# Patient Record
Sex: Male | Born: 1995 | Race: White | Hispanic: No | Marital: Single | Smoking: Former smoker
Health system: Southern US, Community
[De-identification: ages and names within clinical notes are randomized; demographics above are authoritative.]

## PROBLEM LIST (undated history)

## (undated) DIAGNOSIS — J302 Other seasonal allergic rhinitis: Secondary | ICD-10-CM

---
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
## 2022-11-21 ENCOUNTER — Encounter: Payer: Self-pay | Admitting: Family Medicine

## 2022-11-21 ENCOUNTER — Ambulatory Visit: Payer: BC Managed Care – PPO | Admitting: Family Medicine

## 2022-11-21 VITALS — BP 120/80 | HR 75 | Temp 98.4°F | Ht 67.5 in | Wt 209.8 lb

## 2022-11-21 DIAGNOSIS — Z23 Encounter for immunization: Secondary | ICD-10-CM

## 2022-11-21 DIAGNOSIS — Z720 Tobacco use: Secondary | ICD-10-CM | POA: Insufficient documentation

## 2022-11-21 DIAGNOSIS — G43009 Migraine without aura, not intractable, without status migrainosus: Secondary | ICD-10-CM | POA: Diagnosis not present

## 2022-11-21 DIAGNOSIS — Z13220 Encounter for screening for lipoid disorders: Secondary | ICD-10-CM

## 2022-11-21 DIAGNOSIS — J302 Other seasonal allergic rhinitis: Secondary | ICD-10-CM | POA: Insufficient documentation

## 2022-11-21 DIAGNOSIS — G43909 Migraine, unspecified, not intractable, without status migrainosus: Secondary | ICD-10-CM | POA: Insufficient documentation

## 2022-11-21 LAB — LIPID PANEL
Cholesterol: 180 mg/dL (ref 0–200)
HDL: 49 mg/dL (ref >39.00)
LDL Cholesterol: 118 mg/dL — ABNORMAL HIGH (ref 0–99)
NonHDL: 131.31
Total CHOL/HDL Ratio: 4
Triglycerides: 65 mg/dL (ref 0.0–149.0)
VLDL: 13 mg/dL (ref 0.0–40.0)

## 2022-11-21 MED ORDER — SUMATRIPTAN SUCCINATE 50 MG PO TABS: 50.0000 mg | ORAL_TABLET | ORAL | 0 refills | Status: AC | PRN

## 2022-11-21 NOTE — Assessment & Plan Note (Signed)
I will renew his Imitrex. He is having migraines frequently enough, that I would recommend a neurology evaluation to develop a plan for migraine prevention.

## 2022-11-21 NOTE — Progress Notes (Signed)
[place] [facility] [address] [place] [address] Dept: [phone] Dept Fax: [phone]  New Patient Office Visit  Subjective:    Patient ID: Corey Porter, male    DOB: 09/05/1996, 27 y.o..   MRN: [id number]  Chief Complaint  Patient presents with   Establish Care    NP- Establish care.     History of Present Illness:  Patient is in today to establish care. Corey Porter was born in [place]. He received a welding certificate from [employer]. He attended [employer], majoring in Public relations account executive.  He currently works for [employer] as an Chief Financial Officer. He is single and has no children. Corey Porter admits to use of Zyn nicotine pouches. He notes these are 6 mg strength and he uses about 10 per day.He drinks alcohol on the weekends. He denies any drug use.  Corey Porter has a history of migraine headaches. These started in his teen years. He used to be able to effectively manage this with ibuprofen, but this is not very effective any longer. He has been on sumatriptan int he past and this seems to work well. His migraines occur sporadically, but can occur as often as 12 days per month.  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 (Patient not taking: Reported on 11/21/2022)     fluticasone (FLONASE) 50 MCG/ACT nasal spray Place 2 sprays into the nose daily.     Fluticasone Propionate, Inhal, (FLOVENT IN) Inhale 1 puff into the lungs 2 (two) times daily.      No facility-administered medications prior to visit.   No Known Allergies    Objective:   Today's Vitals   11/21/22 1054  BP: 120/80  Pulse: 75  Temp: 98.4 F (36.9 C)  TempSrc: Temporal  SpO2: 98%  Weight: 209 lb 12.8 oz (95.2 kg)  Height: 5' 7.5" (1.715 m)   Body mass index is 32.37 kg/m.   General: Well developed, well nourished. No acute distress. Psych: Alert and oriented. Normal mood and affect.  Health Maintenance Due  Topic Date Due   HPV VACCINES (1 - Male 2-dose series) Never done   HIV Screening  Never done   Hepatitis C Screening  Never done   DTaP/Tdap/Td (8 - Td or Tdap) 11/15/2018     Assessment & Plan:   Problem List Items Addressed This Visit       Cardiovascular and Mediastinum   Migraine headache - Primary    I will renew his Imitrex. He is having migraines frequently enough, that I would recommend a neurology evaluation to develop a plan for migraine prevention.      Relevant Medications   SUMAtriptan (IMITREX) 50 MG tablet   Other Relevant Orders   Ambulatory referral to Neurology  Other Visit Diagnoses     Screening for lipid disorders       Relevant Orders   Lipid panel   Need for diphtheria-tetanus-pertussis (Tdap) vaccine       Relevant Orders   Tdap vaccine greater than or equal to 7yo IM      Return in about 1 year (around 11/22/2023) for Reassessment.   Haydee Salter, MD

## 2022-11-22 ENCOUNTER — Encounter: Payer: Self-pay | Admitting: Family Medicine

## 2022-12-10 ENCOUNTER — Ambulatory Visit: Payer: Self-pay | Admitting: Nurse Practitioner

## 2023-01-09 DIAGNOSIS — J343 Hypertrophy of nasal turbinates: Secondary | ICD-10-CM | POA: Insufficient documentation

## 2023-01-09 DIAGNOSIS — J342 Deviated nasal septum: Secondary | ICD-10-CM | POA: Insufficient documentation

## 2023-11-24 ENCOUNTER — Encounter: Payer: BC Managed Care – PPO | Admitting: Family Medicine

## 2023-12-22 ENCOUNTER — Encounter: Payer: BC Managed Care – PPO | Admitting: Family Medicine

## 2023-12-30 ENCOUNTER — Encounter: Payer: PRIVATE HEALTH INSURANCE | Admitting: Family Medicine

## 2024-01-07 ENCOUNTER — Encounter: Payer: Self-pay | Admitting: Family Medicine

## 2024-01-07 ENCOUNTER — Ambulatory Visit (INDEPENDENT_AMBULATORY_CARE_PROVIDER_SITE_OTHER): Payer: PRIVATE HEALTH INSURANCE | Admitting: Family Medicine

## 2024-01-07 VITALS — BP 118/76 | HR 79 | Temp 98.2°F | Ht 67.5 in | Wt 189.8 lb

## 2024-01-07 DIAGNOSIS — Z0000 Encounter for general adult medical examination without abnormal findings: Secondary | ICD-10-CM | POA: Diagnosis not present

## 2024-01-07 DIAGNOSIS — Z720 Tobacco use: Secondary | ICD-10-CM | POA: Diagnosis not present

## 2024-01-07 NOTE — Assessment & Plan Note (Signed)
 Has stopped cigarette use in the past. Uses nicotine pouches, about 5 a day. We discussed approaches to gradually reduce this, by carrying a day's supply in an empty case.

## 2024-01-07 NOTE — Progress Notes (Signed)
 [facility] [place] [facility] [address] [place] [address] Dept: [phone] Dept Fax: [phone]  Annual Physical Visit  Subjective:    Patient ID: Corey Porter, male    DOB: 1996-05-29, 28 y.o..   MRN: [id number]  Chief Complaint  Patient presents with   Annual Exam    CPE/labs.   Not fasting.   No concerns.    History of Present Illness:  Patient is in today for an annual physical/preventative visit.  Review of Systems  Constitutional:  Negative for chills, diaphoresis, fever, malaise/fatigue and weight loss.  HENT:  Negative for congestion, ear pain, hearing loss, sinus pain, sore throat and tinnitus.   Eyes:  Negative for blurred vision, pain, discharge and redness.  Respiratory:  Negative for cough, shortness of breath and wheezing.   Cardiovascular:  Negative for chest pain and palpitations.  Gastrointestinal:  Negative for abdominal pain, constipation, diarrhea, heartburn, nausea and vomiting.  Musculoskeletal:  Negative for back pain, joint pain and myalgias.       Over the last year, Corey Porter had an episode of right knee locking. Eventually, he was able to move the knee in the right way and it started moving freely again. This has not recurred and his knee is not painful. He also notes an occasional catch in his right posterior ankle near the Achilles tendon. When  this occurs, he finds if he rotates his ankle in a 360 degree arc, the ankle frees up again.  Skin:  Negative for itching and rash.  Neurological:  Positive for headaches.       History of migraine headaches. These have been improved lately. Responds well to sumatriptan.  Psychiatric/Behavioral:  Negative for depression. The patient is not nervous/anxious.    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     SUMAtriptan (IMITREX) 50 MG tablet Take 1 tablet (50 mg total) by mouth every 2 (two) hours as needed for migraine. May repeat in 2 hours if headache persists or recurs. 10 tablet 0   No facility-administered medications prior to visit.   No Known Allergies Objective:   Today's Vitals   01/07/24 1446  BP: 118/76  Pulse: 79  Temp: 98.2 F (36.8 C)  TempSrc: Temporal  SpO2: 98%  Weight: 189 lb 12.8 oz (86.1 kg)  Height: 5' 7.5" (1.715 m)   Body mass index is 29.29 kg/m.   General: Well developed, well nourished. No acute distress. HEENT: Normocephalic, non-traumatic. PERRL, EOMI. Conjunctiva clear. External ears normal. EAC and   TMs normal bilaterally. Nose clear without congestion or rhinorrhea. Mucous membranes moist.   Oropharynx clear. Good dentition. Neck: Supple. No lymphadenopathy. No thyromegaly. Lungs: Clear to auscultation bilaterally.  No wheezing, rales or rhonchi. CV: RRR without murmurs or rubs. Pulses 2+ bilaterally. Abdomen: Soft, non-tender. Bowel sounds positive, normal pitch and frequency. No hepatosplenomegaly.   No rebound or guarding. Back: Straight. No CVA tenderness bilaterally. Extremities: Full ROM. No joint swelling or tenderness. Mild crepitance with a slight popping under the   right patella. No edema noted. Skin: Warm and dry. No  rashes. Psych: Alert and oriented. Normal mood and affect.  Health Maintenance Due  Topic Date Due   HPV VACCINES (1 - Male 3-dose series) Never done   HIV Screening  Never done   Hepatitis C Screening  Never done     Assessment & Plan:   Problem List Items Addressed This Visit       Other   Nicotine use   Has stopped cigarette use in the past. Uses nicotine pouches, about 5 a day. We discussed approaches to gradually reduce this, by carrying a day's supply in an empty case.      Other Visit Diagnoses       Annual physical exam    -  Primary   Overall excellent health. Discussed recommended screenings and immunizations.       Return in about 1 year (around 01/06/2025) for Reassessment.   Loyola Mast, MD

## 2024-05-28 ENCOUNTER — Ambulatory Visit (INDEPENDENT_AMBULATORY_CARE_PROVIDER_SITE_OTHER): Admitting: Nurse Practitioner

## 2024-05-28 ENCOUNTER — Encounter: Payer: Self-pay | Admitting: Nurse Practitioner

## 2024-05-28 ENCOUNTER — Other Ambulatory Visit (HOSPITAL_COMMUNITY)
Admission: RE | Admit: 2024-05-28 | Discharge: 2024-05-28 | Disposition: A | Discharge status: Home / Self Care | Source: Ambulatory Visit | Attending: Nurse Practitioner | Admitting: Nurse Practitioner

## 2024-05-28 VITALS — BP 110/76 | HR 76 | Temp 98.7°F | Ht 67.5 in | Wt 187.6 lb

## 2024-05-28 DIAGNOSIS — N4281 Prostatodynia syndrome: Secondary | ICD-10-CM | POA: Diagnosis present

## 2024-05-28 LAB — POC URINALSYSI DIPSTICK (AUTOMATED)
Bilirubin, UA: NEGATIVE
Blood, UA: NEGATIVE
Glucose, UA: NEGATIVE
Ketones, UA: NEGATIVE
Leukocytes, UA: NEGATIVE
Nitrite, UA: NEGATIVE
Protein, UA: NEGATIVE
Spec Grav, UA: 1.02 (ref 1.010–1.025)
Urobilinogen, UA: 0.2 U/dL
pH, UA: 6.5 (ref 5.0–8.0)

## 2024-05-28 MED ORDER — SULFAMETHOXAZOLE-TRIMETHOPRIM 800-160 MG PO TABS: 1.0000 | ORAL_TABLET | Freq: Two times a day (BID) | ORAL | 0 refills | Status: DC

## 2024-05-28 NOTE — Patient Instructions (Signed)
 It was great to see you!  We are checking your labs today and will let you know the results via mychart/phone.   Start bactrim  1 tablet twice a day for 2 weeks. You can take this with food   Let's follow-up if symptoms worsen or don't improve   Take care,  Tinnie Harada, NP

## 2024-05-28 NOTE — Progress Notes (Signed)
 Acute Office Visit  Subjective:     Patient ID: Corey Porter, male    DOB: 1996/05/06, 28 y.o.   MRN: [id number]  Chief Complaint  Patient presents with   Prostate Pain    Started last Saturday-pain in     HPI Discussed the use of AI scribe software for clinical note transcription with the patient, who gave verbal consent to proceed.  History of Present Illness   Corey Porter is a 28 year old male who presents with prostate pain and urinary symptoms.  He has experienced prostate pain and urinary symptoms for one week. There is a persistent sensation of needing to urinate without actual urination and a feeling of leakage that is not present. Soreness in the prostate area worsens when sitting and improves upon standing.  There is no burning sensation during urination, fever, nausea, rash, abdominal pain, or current back pain. He experienced back pain the day before the visit, which was also felt in the stomach, but denies any current abdominal or back pain. No hematuria.  He has not undergone prior STD testing and is concerned about potential past exposure approximately a year ago. He has not engaged in recent sexual activity but had a past encounter with an unknown individual.  Family history includes his grandfather having had prostate problems when he was younger.      ROS See pertinent positives and negatives per HPI.     Objective:    BP 110/76 (BP Location: Left Arm, Patient Position: Sitting, Cuff Size: Normal)   Pulse 76   Temp 98.7 F (37.1 C)   Ht 5' 7.5 (1.715 m)   Wt 187 lb 9.6 oz (85.1 kg)   SpO2 98%   BMI 28.95 kg/m     Physical Exam Vitals and nursing note reviewed.  Constitutional:      Appearance: Normal appearance.  HENT:     Head: Normocephalic.  Eyes:     Conjunctiva/sclera: Conjunctivae normal.  Pulmonary:     Effort: Pulmonary effort is normal.  Abdominal:     Palpations: Abdomen is soft.     Tenderness: There is no abdominal  tenderness. There is no right CVA tenderness or left CVA tenderness.  Musculoskeletal:     Cervical back: Normal range of motion.  Skin:    General: Skin is warm.  Neurological:     General: No focal deficit present.     Mental Status: He is alert and oriented to person, place, and time.  Psychiatric:        Mood and Affect: Mood normal.        Behavior: Behavior normal.        Thought Content: Thought content normal.        Judgment: Judgment normal.     Results for orders placed or performed in visit on 05/28/24  POCT Urinalysis Dipstick (Automated)  Result Value Ref Range   Color, UA     Clarity, UA     Glucose, UA Negative Negative   Bilirubin, UA Negative    Ketones, UA Negative    Spec Grav, UA 1.020 1.010 - 1.025   Blood, UA Negative    pH, UA 6.5 5.0 - 8.0   Protein, UA Negative Negative   Urobilinogen, UA 0.2 0.2 or 1.0 E.U./dL   Nitrite, UA Negative    Leukocytes, UA Negative Negative        Assessment & Plan:   Problem List Items Addressed This Visit  Other   Painful prostate - Primary   Symptoms indicate prostatitis, with a differential diagnosis including STIs. Urinalysis is negative for blood and infection, reducing the likelihood of kidney stones. Prostatitis is suspected based on symptoms and location. Start bactrim  one pill twice daily for two weeks. Urine sent for culture, gonorrhea, chlamydia, and trich. Advised taking antibiotics with food to minimize stomach discomfort. Instructed to report if symptoms do not improve or recur after treatment. He declined HIV testing today.       Relevant Medications   sulfamethoxazole -trimethoprim  (BACTRIM  DS) 800-160 MG tablet   Other Relevant Orders   POCT Urinalysis Dipstick (Automated) (Completed)   Urine Culture   Urine cytology ancillary only    Meds ordered this encounter  Medications   sulfamethoxazole -trimethoprim  (BACTRIM  DS) 800-160 MG tablet    Sig: Take 1 tablet by mouth 2 (two) times  daily.    Dispense:  28 tablet    Refill:  0    Return if symptoms worsen or fail to improve.  Tinnie DELENA Harada, NP

## 2024-05-28 NOTE — Assessment & Plan Note (Addendum)
 Symptoms indicate prostatitis, with a differential diagnosis including STIs. Urinalysis is negative for blood and infection, reducing the likelihood of kidney stones. Prostatitis is suspected based on symptoms and location. Start bactrim  one pill twice daily for two weeks. Urine sent for culture, gonorrhea, chlamydia, and trich. Advised taking antibiotics with food to minimize stomach discomfort. Instructed to report if symptoms do not improve or recur after treatment. He declined HIV testing today.

## 2024-05-29 LAB — URINE CULTURE
MICRO NUMBER:: 16838250
Result:: NO GROWTH
SPECIMEN QUALITY:: ADEQUATE

## 2024-05-31 ENCOUNTER — Ambulatory Visit: Payer: Self-pay | Admitting: Nurse Practitioner

## 2024-05-31 LAB — URINE CYTOLOGY ANCILLARY ONLY
Chlamydia: NEGATIVE
Comment: NEGATIVE
Comment: NEGATIVE
Comment: NORMAL
Neisseria Gonorrhea: NEGATIVE
Trichomonas: NEGATIVE

## 2024-06-11 ENCOUNTER — Ambulatory Visit (INDEPENDENT_AMBULATORY_CARE_PROVIDER_SITE_OTHER): Admitting: Family Medicine

## 2024-06-11 ENCOUNTER — Ambulatory Visit: Payer: Self-pay

## 2024-06-11 ENCOUNTER — Encounter: Payer: Self-pay | Admitting: Family Medicine

## 2024-06-11 VITALS — BP 114/78 | HR 77 | Temp 98.3°F | Wt 192.8 lb

## 2024-06-11 DIAGNOSIS — R21 Rash and other nonspecific skin eruption: Secondary | ICD-10-CM

## 2024-06-11 DIAGNOSIS — L509 Urticaria, unspecified: Secondary | ICD-10-CM | POA: Diagnosis not present

## 2024-06-11 MED ORDER — PREDNISONE 20 MG PO TABS: 40.0000 mg | ORAL_TABLET | Freq: Every day | ORAL | 0 refills | Status: AC

## 2024-06-11 MED ORDER — METHYLPREDNISOLONE ACETATE 80 MG/ML IJ SUSP
80.0000 mg | Freq: Once | INTRAMUSCULAR | Status: AC
  Administered 2024-06-11: 80 mg via INTRAMUSCULAR

## 2024-06-11 NOTE — Addendum Note (Signed)
 Addended by: GEORGEAN BEEN A on: 06/11/2024 10:10 AM   Modules accepted: Orders

## 2024-06-11 NOTE — Patient Instructions (Addendum)
 Return in about 2 weeks (around 06/25/2024), or if symptoms worsen or fail to improve.   OTC: pepcid twice day, zyrtec and benadryl (before bed) Prednisone  tomorrow for 4 days     Great to see you today.  I have refilled the medication(s) we provide.   If labs were collected or images ordered, we will inform you of  results once we have received them and reviewed. We will contact you either by echart message, or telephone call.  Please give ample time to the testing facility, and our office to run,  receive and review results. Please do not call inquiring of results, even if you can see them in your chart. We will contact you as soon as we are able. If it has been over 1 week since the test was completed, and you have not yet heard from us , then please call us .    - echart message- for normal results that have been seen by the patient already.   - telephone call: abnormal results or if patient has not viewed results in their echart.  If a referral to a specialist was entered for you, please call us  in 2 weeks if you have not heard from the specialist office to schedule.

## 2024-06-11 NOTE — Progress Notes (Signed)
 Corey Porter , 1995/11/10, 28 y.o., male MRN: [id number] Patient Care Team    Relationship Specialty Notifications Start End  Thedora Garnette CHRISTELLA, MD PCP - General Family Medicine  11/21/22   Kay Kemps, MD Consulting Physician Orthopedic Surgery  08/21/16     Chief Complaint  Patient presents with   Rash    Started yesterday; arms, legs, stomach. Severe itching. Pt's top lip started to swell today. Pt has tried Benadryl.      Subjective: Corey Porter is a 28 y.o. Pt presents for an OV with complaints of rash of 2 days duration.   Patient has tried benadryl and it did help. With them my longest was a year and a half Recently started Bactrim  05/28/2024 for 2 weeks for presumed prostatitis.  Urinalysis was normal, urine culture was normal, negative chlamydia, gonorrhea and trichomonas. Patient reports yesterday as soon as he took the Bactrim  he noticed itchiness and rash after medication yesterday. He took it with his lunch. He denies any other exposure or consuming food that is different and could have caused reaction.  He reports initial rash resolved with benadryl and new areas started. Rash is all over his body and today his top lip became swollen.  Pt reports his prostate pain resolved Add Bactrim  to allergy list     01/07/2024    2:56 PM 11/21/2022   10:56 AM  Depression screen PHQ 2/9  Decreased Interest 0 1  Down, Depressed, Hopeless 0 1  PHQ - 2 Score 0 2  Altered sleeping 0 1  Tired, decreased energy 0 1  Change in appetite 0 0  Feeling bad or failure about yourself  0 1  Trouble concentrating 0 0  Moving slowly or fidgety/restless 0 0  Suicidal thoughts 0 0  PHQ-9 Score 0 5  Difficult doing work/chores Not difficult at all Somewhat difficult    Allergies  Allergen Reactions   Bactrim  [Sulfamethoxazole -Trimethoprim ] Hives   Social History   Social History Narrative   Not on file   Past Medical History:  Diagnosis Date   Seasonal allergies     Past Surgical History:  Procedure Laterality Date   KNEE ARTHROSCOPY W/ SYNOVECTOMY  2017   Family History  Problem Relation Age of Onset   Migraines Mother    Migraines Maternal Grandmother    Cancer Paternal Grandmother    Stroke Paternal Grandfather    Allergies as of 06/11/2024       Reactions   Bactrim  [sulfamethoxazole -trimethoprim ] Hives        Medication List        Accurate as of June 11, 2024 10:06 AM. If you have any questions, ask your nurse or doctor.          STOP taking these medications    cetirizine 5 MG tablet Commonly known as: ZYRTEC Stopped by: Charlies Bellini       TAKE these medications    predniSONE  20 MG tablet Commonly known as: DELTASONE  Take 2 tablets (40 mg total) by mouth daily with breakfast for 4 days. Started by: Jadin Creque   sulfamethoxazole -trimethoprim  800-160 MG tablet Commonly known as: BACTRIM  DS Take 1 tablet by mouth 2 (two) times daily.   SUMAtriptan  50 MG tablet Commonly known as: Imitrex  Take 1 tablet (50 mg total) by mouth every 2 (two) hours as needed for migraine. May repeat in 2 hours if headache persists or recurs.        All past medical  history, surgical history, allergies, family history, immunizations andmedications were updated in the EMR today and reviewed under the history and medication portions of their EMR.     ROS Negative, with the exception of above mentioned in HPI   Objective:  BP 114/78   Pulse 77   Temp 98.3 F (36.8 C)   Wt 192 lb 12.8 oz (87.5 kg)   SpO2 97%   BMI 29.75 kg/m  Body mass index is 29.75 kg/m.  Physical Exam Vitals and nursing note reviewed. Exam conducted with a chaperone present.  Constitutional:      General: He is not in acute distress.    Appearance: Normal appearance. He is not ill-appearing, toxic-appearing or diaphoretic.  HENT:     Head: Normocephalic and atraumatic.  Eyes:     General: No scleral icterus.       Right eye: No discharge.         Left eye: No discharge.     Extraocular Movements: Extraocular movements intact.     Pupils: Pupils are equal, round, and reactive to light.  Skin:    General: Skin is warm and dry.     Coloration: Skin is not jaundiced or pale.     Findings: Rash present.     Comments: Macular papular rash diffusely over body. Top lip with small amount of swelling.   Neurological:     Mental Status: He is alert and oriented to person, place, and time. Mental status is at baseline.  Psychiatric:        Mood and Affect: Mood normal.        Behavior: Behavior normal.        Thought Content: Thought content normal.        Judgment: Judgment normal.      No results found. No results found. No results found for this or any previous visit (from the past 24 hours).  Assessment/Plan: Corey Porter is a 28 y.o. male present for OV for  Rash/Urticaria (Primary) Suspect delayed allergic reaction from bactrim . Added to allergy list. Encouraged patient to avoid sulphur containing products in the future to be safe DC bactrim - tests neg and sx resolved- only had a few days left.  Can continue benadryl before bed.  Restart zyrtec nightly (2-4 weeks) Start pepcid BID IM depo medrol  80 mg injection provided today Start short burst of prednisone  tomorrow   Reviewed expectations re: course of current medical issues. Discussed self-management of symptoms. Outlined signs and symptoms indicating need for more acute intervention. Patient verbalized understanding and all questions were answered. Patient received an After-Visit Summary.    No orders of the defined types were placed in this encounter.  Meds ordered this encounter  Medications   predniSONE  (DELTASONE ) 20 MG tablet    Sig: Take 2 tablets (40 mg total) by mouth daily with breakfast for 4 days.    Dispense:  8 tablet    Refill:  0   Referral Orders  No referral(s) requested today     Note is dictated utilizing voice recognition  software. Although note has been proof read prior to signing, occasional typographical errors still can be missed. If any questions arise, please do not hesitate to call for verification.   electronically signed by:  Charlies Bellini, DO  [facility] [place]

## 2024-06-11 NOTE — Telephone Encounter (Signed)
 FYI Only or Action Required?: FYI only for provider.  Patient was last seen in primary care on 05/28/2024 by Nedra Tinnie LABOR, NP.  Called Nurse Triage reporting Allergic Reaction.  Symptoms began yesterday.  Interventions attempted: Prescription medications: sulfamethoxazole -trimethoprim  (BACTRIM  DS).  Symptoms are: unchanged.  Triage Disposition: See Physician Within 24 Hours  Patient/caregiver understands and will follow disposition?: Yes       Copied from CRM #[id number]. Topic: Clinical - Red Word Triage >> Jun 11, 2024  8:40 AM Adelita E wrote: [facility] that prompted transfer to Nurse Triage: Reaction to antibiotic. Patient stated he took his antibiotic yesterday and his lips swelled up last night, and he broke out in hives all over his body which are itchy. Reason for Disposition  [1] MODERATE-SEVERE hives (e.g.,hives interfere with normal activities or work) AND [2] not improved after taking antihistamine (e.g., cetirizine, fexofenadine, or loratadine) > 24 hours  Answer Assessment - Initial Assessment Questions 1. APPEARANCE: What does the rash look like?      Itchy rash 2. LOCATION: Where is the rash located?      Upper arms, chest, trunk, legs, upper lip  - denies difficulty breathing/swollen tongue 3. NUMBER: How many hives are there?      multiple 4. SIZE: How big are the hives? (e.g., inches, cm, compare to coins) Do they all look the same or do they vary in shape and size?      various 5. ONSET: When did the hives begin? (e.g., hours or days ago)      yesterday 6. ITCHING: Does it itch? If Yes, ask: How bad is the itch?  (e.g., none, mild, moderate, severe)     yes 7. RECURRENT PROBLEM: Have you had hives before? If Yes, ask: When was the last time? and What happened that time?      First time 8. TRIGGERS: Were you exposed to any new food, plant, cosmetic product or animal just before the hives began?     Endorses took recent abx and  sx started after taking rx - sulfamethoxazole -trimethoprim  (BACTRIM  DS) - has been taking for 2 weeks, but sx occurred yesterday 9. OTHER SYMPTOMS: Do you have any other symptoms? (e.g., fever, tongue swelling, difficulty breathing, abdomen pain)     denies 10. PREGNANCY: Is there any chance you are pregnant? When was your last menstrual period?       N/a    Triager attempted to schedule with PCP, but no access. Triager scheduled at alternate location.  Protocols used: Hives-A-AH

## 2024-06-11 NOTE — Telephone Encounter (Signed)
 I called and spoke with patient and he could not think of the name but this issue has been addressed per patient and he went to a physician in [facility] today.  Thanks, BMS/CMA

## 2024-06-25 ENCOUNTER — Ambulatory Visit: Admitting: Family Medicine

## 2024-08-10 ENCOUNTER — Telehealth: Payer: Self-pay | Admitting: Family Medicine

## 2024-08-10 ENCOUNTER — Telehealth: Payer: Self-pay

## 2024-08-10 NOTE — Telephone Encounter (Signed)
 Copied from CRM #[id number]. Topic: Clinical - Medication Question >> Aug 10, 2024 10:39 AM Charolett L wrote: Reason for CRM: Patient called to request a medication that's not on his medication list and wanted to speak with the nurse or doctor. Patient requesting a call back

## 2024-08-10 NOTE — Telephone Encounter (Signed)
 Copied from CRM #[id number]. Topic: Clinical - Medication Question >> Aug 10, 2024  3:25 PM Mercedes MATSU wrote: Reason for CRM: Patient called in requesting to speak with a nurse because he has some questions in regards to his medications. Patient can be reached at [phone].

## 2024-08-10 NOTE — Telephone Encounter (Signed)
 Called and scheduled him an appt. Dm/cma

## 2024-08-10 NOTE — Telephone Encounter (Signed)
 Called and spoke to patient, he is still having some prostrate issues as well as questions regarding a medication for ED?  Scheduled him an appointment for 08/16/24 @4 :00 pm. Dm/cma

## 2024-08-16 ENCOUNTER — Encounter: Payer: Self-pay | Admitting: Family Medicine

## 2024-08-16 ENCOUNTER — Ambulatory Visit (INDEPENDENT_AMBULATORY_CARE_PROVIDER_SITE_OTHER): Admitting: Family Medicine

## 2024-08-16 VITALS — BP 122/80 | HR 86 | Temp 98.9°F | Ht 67.5 in | Wt 189.4 lb

## 2024-08-16 DIAGNOSIS — N529 Male erectile dysfunction, unspecified: Secondary | ICD-10-CM | POA: Insufficient documentation

## 2024-08-16 DIAGNOSIS — J069 Acute upper respiratory infection, unspecified: Secondary | ICD-10-CM | POA: Insufficient documentation

## 2024-08-16 DIAGNOSIS — N411 Chronic prostatitis: Secondary | ICD-10-CM | POA: Insufficient documentation

## 2024-08-16 MED ORDER — TADALAFIL 10 MG PO TABS: 10.0000 mg | ORAL_TABLET | ORAL | 1 refills | Status: AC | PRN

## 2024-08-16 NOTE — Assessment & Plan Note (Signed)
 Appears to have resolved. No current sign of illness.

## 2024-08-16 NOTE — Assessment & Plan Note (Signed)
 We will give a trial of tadalafil 10 mg PRN intercourse.

## 2024-08-16 NOTE — Assessment & Plan Note (Signed)
I will renew his Imitrex. He is having migraines frequently enough, that I would recommend a neurology evaluation to develop a plan for migraine prevention.

## 2024-08-16 NOTE — Assessment & Plan Note (Signed)
 Mr. Bulman symptoms are suggestive of chronic prostatitis vs. residual inflammation form his acute prostatitis. I recommend he try using Aleve 220 mg bid for 3-4 days when he has flares of this.

## 2024-08-16 NOTE — Progress Notes (Signed)
 [facility] [address] [place] [address] Dept: [phone] Dept Fax: [phone]  Chronic Care Office Visit  Subjective:    Patient ID: GLENDALE YOUNGBLOOD, male    DOB: [birth year], 28 y.o..   MRN: [id number]  Chief Complaint  Patient presents with   Cough    C/o having a persistent cough and questions about the prostatitis.     History of Present Illness:  Patient is in today for reassessment of chronic medical issues.  Mr. Bellin notes that he had a recent upper respiratory illness with associated laryngitis and cough productive of considerable mucous. He notes the cough had been occuring for 3 weeks. He is finding that today he is no longer coughing.  Mr. Hoen was seen in August with a clinical presentation consistent with an acute prostatitis. He was prescribed a 14-day course of Septra . However, he notes he developed hives, so only took the medication for 10 days. Since that time, he notes intermittent episodes of some discomfort that he feels relates to his prostate. He is having no current dysuria and no penile discharge.  M.r Westfall also notes an issues with his erections not lasting long enough to complete intercourse. He asks about whether tadalafil could be an option for him.  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 28 tablet 0   SUMAtriptan  (IMITREX ) 50 MG tablet Take 1 tablet (50 mg total) by mouth every 2 (two) hours as needed for migraine. May repeat in 2 hours if headache persists or recurs. 10 tablet 0   No facility-administered medications prior to visit.   Allergies  Allergen Reactions   Bactrim  [Sulfamethoxazole -Trimethoprim ] Hives     Objective:   Today's Vitals   08/16/24 1556  BP: 122/80  Pulse: 86  Temp: 98.9 F (37.2 C)  TempSrc: Temporal  SpO2: 97%  Weight: 189 lb 6.4 oz (85.9 kg)  Height: 5' 7.5 (1.715 m)   Body mass index is 29.23 kg/m.   General: Well developed, well nourished. No acute distress. HEENT: Normocephalic, non-traumatic. PERRL, EOMI. Conjunctiva clear. External ears normal. EAC and TMs normal   bilaterally. Nose clear without congestion or rhinorrhea. Mucous membranes moist. Oropharynx clear. Good dentition. Neck: Supple. No lymphadenopathy. No thyromegaly. Lungs: Clear to auscultation bilaterally. No wheezing, rales or rhonchi. Psych: Alert and oriented. Normal mood and affect.  Health Maintenance Due  Topic Date Due   HPV VACCINES (1 - Male 3-dose series) Never done   HIV Screening  Never done   Hepatitis C Screening  Never done   COVID-19 Vaccine (1 - 2025-26 season) Never done   Assessment & Plan:  Problem List Items Addressed This Visit       Respiratory   Viral URI with cough - Primary   Appears to have resolved. No current sign of illness.        Genitourinary   Chronic prostatitis   Mr. Lepera symptoms are suggestive of chronic prostatitis vs. residual inflammation form his acute prostatitis. I recommend he try using Aleve 220 mg bid for 3-4 days when he has flares of  this.        Other   Erectile dysfunction   We will give a trial of tadalafil 10 mg PRN intercourse.      Relevant Medications   tadalafil (CIALIS) 10 MG tablet    Return if symptoms worsen or fail to improve.   Garnette CHRISTELLA Simpler, MD  I,Emily Lagle,acting as a scribe for Garnette CHRISTELLA Simpler, MD.,have documented all relevant documentation on the behalf of Garnette CHRISTELLA Simpler, MD.  I, Garnette CHRISTELLA Simpler, MD, have reviewed all documentation for this visit. The documentation on 08/16/2024 for the exam, diagnosis, procedures, and orders are all accurate and complete.
